# Patient Record
Sex: Female | Born: 2000 | Race: Black or African American | Hispanic: No | Marital: Single | Smoking: Current every day smoker
Health system: Southern US, Community
[De-identification: ages and names within clinical notes are randomized; demographics above are authoritative.]

---
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
## 2020-04-13 ENCOUNTER — Emergency Department (HOSPITAL_COMMUNITY)
Admission: EM | Admit: 2020-04-13 | Discharge: 2020-04-13 | Disposition: A | Discharge status: Home / Self Care | Payer: Medicaid Other | Attending: Emergency Medicine | Admitting: Emergency Medicine

## 2020-04-13 ENCOUNTER — Other Ambulatory Visit: Payer: Self-pay

## 2020-04-13 ENCOUNTER — Encounter (HOSPITAL_COMMUNITY): Payer: Self-pay | Admitting: Pediatrics

## 2020-04-13 DIAGNOSIS — R0981 Nasal congestion: Secondary | ICD-10-CM | POA: Insufficient documentation

## 2020-04-13 DIAGNOSIS — Z79899 Other long term (current) drug therapy: Secondary | ICD-10-CM | POA: Diagnosis not present

## 2020-04-13 MED ORDER — CETIRIZINE HCL 10 MG PO TABS: 10.0000 mg | ORAL_TABLET | Freq: Every day | ORAL | 0 refills | Status: DC | PRN

## 2020-04-13 MED ORDER — BENZONATATE 100 MG PO CAPS: 100.0000 mg | ORAL_CAPSULE | Freq: Three times a day (TID) | ORAL | 0 refills | Status: DC

## 2020-04-13 MED ORDER — FLUTICASONE PROPIONATE 50 MCG/ACT NA SUSP: 1.0000 | Freq: Every day | NASAL | 0 refills | Status: DC | PRN

## 2020-04-13 NOTE — Discharge Instructions (Addendum)
You were seen in the emergency today for upper respiratory symptoms, we suspect your symptoms are related to allergies or a virus at this time.  I have prescribed you multiple medications to treat your symptoms.   -Flonase to be used 1 spray in each nostril daily.  This medication is used to treat your congestion.  -Tessalon can be taken once every 8 hours as needed.  This medication is used to treat your cough.  -Zyrtec- take once daily as needed for allergies.  We have prescribed you new medication(s) today. Discuss the medications prescribed today with your pharmacist as they can have adverse effects and interactions with your other medicines including over the counter and prescribed medications. Seek medical evaluation if you start to experience new or abnormal symptoms after taking one of these medicines, seek care immediately if you start to experience difficulty breathing, feeling of your throat closing, facial swelling, or rash as these could be indications of a more serious allergic reaction  You will need to follow-up with your primary care provider in 1 week if your symptoms have not improved.  If you do not have a primary care provider one is provided in your discharge instructions.  Return to the emergency department for any new or worsening symptoms including but not limited to persistent fever for 5 days, difficulty breathing, chest pain, rashes, passing out, or any other concerns.

## 2020-04-13 NOTE — ED Triage Notes (Signed)
URI symptoms x 3 days; denies past medical history

## 2020-04-13 NOTE — ED Provider Notes (Signed)
[facility] EMERGENCY DEPARTMENT Provider Note   CSN: [id number] Arrival date & time: 04/13/20  1221     History Chief Complaint  Patient presents with  . Nasal Congestion    Sandra Moreno is a 19 y.o. female without significant past medical hx who presents to the ED with complaints of nasal congestion x 5 days. Patient reports nasal congestion, ear pressure, sinus pressure, and cough productive of sputum. No alleviating/aggravating factors. Denies fever, chills, dyspnea, chest pain, or vomiting. Was having sore throat but this resolved. Feels like prior allergies.   HPI     History reviewed. No pertinent past medical history.  There are no problems to display for this patient.   History reviewed. No pertinent surgical history.   OB History   No obstetric history on file.     No family history on file.  Social History   Tobacco Use  . Smoking status: Never Smoker  . Smokeless tobacco: Never Used  Substance Use Topics  . Alcohol use: No  . Drug use: No    Home Medications Prior to Admission medications   Medication Sig Start Date End Date Taking? Authorizing Provider  ferrous sulfate (EQL SLOW RELEASE IRON) 160 (50 Fe) MG TBCR SR tablet Take 1 tablet (160 mg total) by mouth daily. 08/30/16   Emily Filbert, MD  ferrous sulfate (EQL SLOW RELEASE IRON) 160 (50 Fe) MG TBCR SR tablet Take 1 tablet (160 mg total) by mouth daily. 08/30/16   Emily Filbert, MD  fluticasone (FLONASE) 50 MCG/ACT nasal spray Place 1 spray into both nostrils daily. 08/28/15   Menshew, Charlesetta Ivory, PA-C  hydrOXYzine (ATARAX/VISTARIL) 50 MG tablet Take 1 tablet (50 mg total) by mouth 3 (three) times daily as needed. 05/27/15   Joni Reining, PA-C  ibuprofen (ADVIL,MOTRIN) 600 MG tablet Take 1 tablet (600 mg total) by mouth every 8 (eight) hours as needed. 08/30/16   Emily Filbert, MD  methylPREDNISolone (MEDROL DOSEPAK) 4 MG TBPK tablet Take Tapered  dose as directed 05/27/15   Joni Reining, PA-C    Allergies    Augmentin [amoxicillin-pot clavulanate]  Review of Systems   Review of Systems  Constitutional: Negative for chills and fever.  HENT: Positive for congestion, ear pain, sinus pressure and sore throat (resolved). Negative for dental problem, trouble swallowing and voice change.   Respiratory: Positive for cough. Negative for shortness of breath.   Cardiovascular: Negative for chest pain.  Gastrointestinal: Negative for abdominal pain and vomiting.    Physical Exam Updated Vital Signs BP 125/75 (BP Location: Left Arm)   Pulse 99   Temp 98.7 F (37.1 C) (Oral)   Resp 18   Ht 5\' 5"  (1.651 m)   Wt 122.5 kg   SpO2 98%   BMI 44.93 kg/m   Physical Exam Vitals and nursing note reviewed.  Constitutional:      General: She is not in acute distress.    Appearance: She is well-developed.  HENT:     Head: Normocephalic and atraumatic.     Right Ear: Ear canal normal. Tympanic membrane is not perforated, erythematous, retracted or bulging.     Left Ear: Ear canal normal. Tympanic membrane is not perforated, erythematous, retracted or bulging.     Ears:     Comments: No mastoid erythema/swelling/tenderness.     Nose: Congestion present.     Right Sinus: No maxillary sinus tenderness or frontal sinus tenderness.     Left  Sinus: No maxillary sinus tenderness or frontal sinus tenderness.     Comments: Boggy turbinates present.     Mouth/Throat:     Pharynx: Uvula midline. No oropharyngeal exudate or posterior oropharyngeal erythema.     Comments: Posterior oropharynx is symmetric appearing. Patient tolerating own secretions without difficulty. No trismus. No drooling. No hot potato voice. No swelling beneath the tongue, submandibular compartment is soft.  Eyes:     General:        Right eye: No discharge.        Left eye: No discharge.     Conjunctiva/sclera: Conjunctivae normal.     Pupils: Pupils are equal, round, and  reactive to light.  Cardiovascular:     Rate and Rhythm: Normal rate and regular rhythm.     Heart sounds: No murmur heard.   Pulmonary:     Effort: Pulmonary effort is normal. No respiratory distress.     Breath sounds: Normal breath sounds. No wheezing, rhonchi or rales.  Abdominal:     General: There is no distension.     Palpations: Abdomen is soft.     Tenderness: There is no abdominal tenderness.  Musculoskeletal:     Cervical back: Normal range of motion and neck supple. No edema or rigidity.  Lymphadenopathy:     Cervical: No cervical adenopathy.  Skin:    General: Skin is warm and dry.     Findings: No rash.  Neurological:     Mental Status: She is alert.  Psychiatric:        Behavior: Behavior normal.     ED Results / Procedures / Treatments   Labs (all labs ordered are listed, but only abnormal results are displayed) Labs Reviewed - No data to display  EKG None  Radiology No results found.  Procedures Procedures (including critical care time)  Medications Ordered in ED Medications - No data to display  ED Course  I have reviewed the triage vital signs and the nursing notes.  Pertinent labs & imaging results that were available during my care of the patient were reviewed by me and considered in my medical decision making (see chart for details).  Sandra Moreno was evaluated in Emergency Department on 04/13/2020 for the symptoms described in the history of present illness. He/she was evaluated in the context of the global COVID-19 pandemic, which necessitated consideration that the patient might be at risk for infection with the SARS-CoV-2 virus that causes COVID-19. Institutional protocols and algorithms that pertain to the evaluation of patients at risk for COVID-19 are in a state of rapid change based on information released by regulatory bodies including the CDC and federal and state organizations. These policies and algorithms were followed during the  patient's care in the ED.    MDM Rules/Calculators/A&P                         Patient presents with upper respiratory sxs. Patient is nontoxic appearing, in no apparent distress, vitals are WNL. Patient is afebrile in the ED, lungs are CTA, low suspicion for pneumonia- discussed option of CXR, patient in agreement with forgoing this. There is no wheezing or signs of respiratory distress. Sxs onset < 7 days, afebrile, no sinus tenderness, doubt acute bacterial sinusitis. Centor score 0, doubt strep pharyngitis. No evidence of AOM on exam. No meningeal signs. No history components or rashes to raise concern for tic borne illness. Suspect viral vs allergic etiology at this time,  favor allergic, and will treat supportively with Flonase, tessalon, and zyrtec. I discussed treatment plan, need for PCP follow-up, and return precautions with the patient. Provided opportunity for questions, patient confirmed understanding and is in agreement with plan.    Final Clinical Impression(s) / ED Diagnoses Final diagnoses:  Nasal congestion    Rx / DC Orders ED Discharge Orders         Ordered    fluticasone (FLONASE) 50 MCG/ACT nasal spray  Daily PRN     Discontinue  Reprint     04/13/20 1403    benzonatate (TESSALON) 100 MG capsule  Every 8 hours     Discontinue  Reprint     04/13/20 1403    cetirizine (ZYRTEC ALLERGY) 10 MG tablet  Daily PRN     Discontinue  Reprint     04/13/20 1403           Jahmani Staup, Pleas Koch, PA-C 04/13/20 1404    Terrilee Files, MD 04/13/20 Ernestina Columbia

## 2020-05-10 DIAGNOSIS — Z20822 Contact with and (suspected) exposure to covid-19: Secondary | ICD-10-CM | POA: Diagnosis not present

## 2020-05-10 DIAGNOSIS — Z03818 Encounter for observation for suspected exposure to other biological agents ruled out: Secondary | ICD-10-CM | POA: Diagnosis not present

## 2022-11-21 DIAGNOSIS — Z114 Encounter for screening for human immunodeficiency virus [HIV]: Secondary | ICD-10-CM | POA: Diagnosis not present

## 2022-11-21 DIAGNOSIS — Z113 Encounter for screening for infections with a predominantly sexual mode of transmission: Secondary | ICD-10-CM | POA: Diagnosis not present

## 2023-01-09 DIAGNOSIS — Z113 Encounter for screening for infections with a predominantly sexual mode of transmission: Secondary | ICD-10-CM | POA: Diagnosis not present

## 2023-01-10 ENCOUNTER — Ambulatory Visit (INDEPENDENT_AMBULATORY_CARE_PROVIDER_SITE_OTHER): Payer: Medicaid Other | Admitting: Primary Care

## 2023-01-10 ENCOUNTER — Encounter (INDEPENDENT_AMBULATORY_CARE_PROVIDER_SITE_OTHER): Payer: Self-pay

## 2023-02-12 ENCOUNTER — Telehealth (INDEPENDENT_AMBULATORY_CARE_PROVIDER_SITE_OTHER): Payer: Self-pay | Admitting: Primary Care

## 2023-02-12 NOTE — Telephone Encounter (Signed)
Called pt and left a VM. 02/12/23

## 2023-02-13 ENCOUNTER — Ambulatory Visit (INDEPENDENT_AMBULATORY_CARE_PROVIDER_SITE_OTHER): Payer: Medicaid Other | Admitting: Primary Care

## 2023-02-13 ENCOUNTER — Encounter (INDEPENDENT_AMBULATORY_CARE_PROVIDER_SITE_OTHER): Payer: Self-pay | Admitting: Primary Care

## 2023-02-13 VITALS — BP 124/83 | HR 81 | Resp 16 | Ht 65.0 in | Wt 255.0 lb

## 2023-02-13 DIAGNOSIS — Z7689 Persons encountering health services in other specified circumstances: Secondary | ICD-10-CM

## 2023-02-13 DIAGNOSIS — Z1159 Encounter for screening for other viral diseases: Secondary | ICD-10-CM

## 2023-02-13 DIAGNOSIS — G43109 Migraine with aura, not intractable, without status migrainosus: Secondary | ICD-10-CM | POA: Diagnosis not present

## 2023-02-13 DIAGNOSIS — N921 Excessive and frequent menstruation with irregular cycle: Secondary | ICD-10-CM

## 2023-02-13 DIAGNOSIS — Z833 Family history of diabetes mellitus: Secondary | ICD-10-CM | POA: Diagnosis not present

## 2023-02-13 DIAGNOSIS — Z23 Encounter for immunization: Secondary | ICD-10-CM

## 2023-02-13 DIAGNOSIS — Z6841 Body Mass Index (BMI) 40.0 and over, adult: Secondary | ICD-10-CM | POA: Diagnosis not present

## 2023-02-13 DIAGNOSIS — G43909 Migraine, unspecified, not intractable, without status migrainosus: Secondary | ICD-10-CM | POA: Insufficient documentation

## 2023-02-13 DIAGNOSIS — Z114 Encounter for screening for human immunodeficiency virus [HIV]: Secondary | ICD-10-CM | POA: Diagnosis not present

## 2023-02-13 NOTE — Patient Instructions (Addendum)
Migraine Headache A migraine headache is an intense pulsing or throbbing pain on one or both sides of the head. Migraine headaches may also cause other symptoms, such as nausea, vomiting, and sensitivity to light and noise. A migraine headache can last from 4 hours to 3 days. Talk with your health care provider about what things may bring on (trigger) your migraine headaches. What are the causes? The exact cause is not known. However, a migraine may be caused when nerves in the brain get irritated and release chemicals that cause blood vessels to become inflamed. This inflammation causes pain. Migraines may be triggered or caused by: Smoking. Medicines, such as: Nitroglycerin, which is used to treat chest pain. Birth control pills. Estrogen. Certain blood pressure medicines. Foods or drinks that contain nitrates, glutamate, aspartame, MSG, or tyramine. Certain foods or drinks, such as aged cheeses, chocolate, alcohol, or caffeine. Doing physical activity that is very hard. Other triggers may include: Menstruation. Pregnancy. Hunger. Stress. Getting too much or too little sleep. Weather changes. Tiredness (fatigue). What increases the risk? The following factors may make you more likely to have migraine headaches: Being between the ages of 25-55 years old. Being female. Having a family history of migraine headaches. Being Caucasian. Having a mental health condition, such as depression or anxiety. Being obese. What are the signs or symptoms? The main symptom of this condition is pulsing or throbbing pain. This pain may: Happen in any area of the head, such as on one or both sides. Make it hard to do daily activities. Get worse with physical activity. Get worse around bright lights, loud noises, or smells. Other symptoms may include: Nausea. Vomiting. Dizziness. Before a migraine headache starts, you may get warning signs (an aura). An aura may include: Seeing flashing lights or  having blind spots. Seeing bright spots, halos, or zigzag lines. Having tunnel vision or blurred vision. Having numbness or a tingling feeling. Having trouble talking. Having muscle weakness. After a migraine ends, you may have symptoms. These may include: Feeling tired. Trouble concentrating. How is this diagnosed? A migraine headache can be diagnosed based on: Your symptoms. A physical exam. Tests, such as: A CT scan or an MRI of the head. These tests can help rule out other causes of headaches. Taking fluid from the spine (lumbar puncture) to examine it (cerebrospinal fluid analysis, or CSF analysis). How is this treated? This condition may be treated with medicines that: Relieve pain and nausea. Prevent migraines. Treatment may also include: Acupuncture. Lifestyle changes like avoiding foods that trigger migraine headaches. Learning ways to control your body (biofeedback). Talk therapy to help you know and deal with negative thoughts (cognitive behavioral therapy). Follow these instructions at home: Medicines Take over-the-counter and prescription medicines only as told by your provider. Ask your provider if the medicine prescribed to you: Requires you to avoid driving or using machinery. Can cause constipation. You may need to take these actions to prevent or treat constipation: Drink enough fluid to keep your pee (urine) pale yellow. Take over-the-counter or prescription medicines. Eat foods that are high in fiber, such as beans, whole grains, and fresh fruits and vegetables. Limit foods that are high in fat and processed sugars, such as fried or sweet foods. Lifestyle  Do not drink alcohol. Do not use any products that contain nicotine or tobacco. These products include cigarettes, chewing tobacco, and vaping devices, such as e-cigarettes. If you need help quitting, ask your provider. Get 7-9 hours of sleep each night, or the   amount recommended by your provider. Find  ways to manage stress, such as meditation, deep breathing, or yoga. Try to exercise regularly. This can help lessen how bad and how often your migraines occur. General instructions Keep a journal to find out what triggers your migraines, so you can avoid those things. For example, write down: What you eat and drink. How much sleep you get. Any change to your diet or medicines. If you have a migraine headache: Avoid things that make your symptoms worse, such as bright lights. Lie down in a dark, quiet room. Do not drive or use machinery. Ask your provider what activities are safe for you while you have symptoms. Keep all follow-up visits. Your provider will monitor your symptoms and recommend any further treatment. Where to find more information Coalition for Headache and Migraine Patients (CHAMP): headachemigraine.org American Migraine Foundation: americanmigrainefoundation.org National Headache Foundation: headaches.org Contact a health care provider if: You have symptoms that are different or worse than your usual migraine headache symptoms. You have more than 15 days of headaches in one month. Get help right away if: Your migraine headache becomes severe or lasts more than 72 hours. You have a fever or stiff neck. You have vision loss. Your muscles feel weak or like you cannot control them. You lose your balance often or have trouble walking. You faint. You have a seizure. This information is not intended to replace advice given to you by your health care provider. Make sure you discuss any questions you have with your health care provider. Document Revised: 05/14/2022 Document Reviewed: 05/14/2022 Elsevier Patient Education  2023 [employer] Calorie Counting for Sandra Moreno Loss Calories are units of energy. Your body needs a certain number of calories from food to keep going throughout the day. When you eat or drink more calories than your body needs, your body stores the extra  calories mostly as fat. When you eat or drink fewer calories than your body needs, your body burns fat to get the energy it needs. Calorie counting means keeping track of how many calories you eat and drink each day. Calorie counting can be helpful if you need to lose weight. If you eat fewer calories than your body needs, you should lose weight. Ask your health care provider what a healthy weight is for you. For calorie counting to work, you will need to eat the right number of calories each day to lose a healthy amount of weight per week. A dietitian can help you figure out how many calories you need in a day and will suggest ways to reach your calorie goal. A healthy amount of weight to lose each week is usually 1-2 lb (0.5-0.9 kg). This usually means that your daily calorie intake should be reduced by 500-750 calories. Eating 1,200-1,500 calories a day can help most women lose weight. Eating 1,500-1,800 calories a day can help most men lose weight. What do I need to know about calorie counting? Work with your health care provider or dietitian to determine how many calories you should get each day. To meet your daily calorie goal, you will need to: Find out how many calories are in each food that you would like to eat. Try to do this before you eat. Decide how much of the food you plan to eat. Keep a food log. Do this by writing down what you ate and how many calories it had. To successfully lose weight, it is important to balance calorie counting with a healthy lifestyle that  includes regular activity. Where do I find calorie information?  The number of calories in a food can be found on a Nutrition Facts label. If a food does not have a Nutrition Facts label, try to look up the calories online or ask your dietitian for help. Remember that calories are listed per serving. If you choose to have more than one serving of a food, you will have to multiply the calories per serving by the number of  servings you plan to eat. For example, the label on a package of bread might say that a serving size is 1 slice and that there are 90 calories in a serving. If you eat 1 slice, you will have eaten 90 calories. If you eat 2 slices, you will have eaten 180 calories. How do I keep a food log? After each time that you eat, record the following in your food log as soon as possible: What you ate. Be sure to include toppings, sauces, and other extras on the food. How much you ate. This can be measured in cups, ounces, or number of items. How many calories were in each food and drink. The total number of calories in the food you ate. Keep your food log near you, such as in a pocket-sized notebook or on an app or website on your mobile phone. Some programs will calculate calories for you and show you how many calories you have left to meet your daily goal. What are some portion-control tips? Know how many calories are in a serving. This will help you know how many servings you can have of a certain food. Use a measuring cup to measure serving sizes. You could also try weighing out portions on a kitchen scale. With time, you will be able to estimate serving sizes for some foods. Take time to put servings of different foods on your favorite plates or in your favorite bowls and cups so you know what a serving looks like. Try not to eat straight from a food's packaging, such as from a bag or box. Eating straight from the package makes it hard to see how much you are eating and can lead to overeating. Put the amount you would like to eat in a cup or on a plate to make sure you are eating the right portion. Use smaller plates, glasses, and bowls for smaller portions and to prevent overeating. Try not to multitask. For example, avoid watching TV or using your computer while eating. If it is time to eat, sit down at a table and enjoy your food. This will help you recognize when you are full. It will also help you be  more mindful of what and how much you are eating. What are tips for following this plan? Reading food labels Check the calorie count compared with the serving size. The serving size may be smaller than what you are used to eating. Check the source of the calories. Try to choose foods that are high in protein, fiber, and vitamins, and low in saturated fat, trans fat, and sodium. Shopping Read nutrition labels while you shop. This will help you make healthy decisions about which foods to buy. Pay attention to nutrition labels for low-fat or fat-free foods. These foods sometimes have the same number of calories or more calories than the full-fat versions. They also often have added sugar, starch, or salt to make up for flavor that was removed with the fat. Make a grocery list of lower-calorie foods and stick  to it. Cooking Try to cook your favorite foods in a healthier way. For example, try baking instead of frying. Use low-fat dairy products. Meal planning Use more fruits and vegetables. One-half of your plate should be fruits and vegetables. Include lean proteins, such as chicken, [place], and fish. Lifestyle Each week, aim to do one of the following: 150 minutes of moderate exercise, such as walking. 75 minutes of vigorous exercise, such as running. General information Know how many calories are in the foods you eat most often. This will help you calculate calorie counts faster. Find a way of tracking calories that works for you. Get creative. Try different apps or programs if writing down calories does not work for you. What foods should I eat?  Eat nutritious foods. It is better to have a nutritious, high-calorie food, such as an avocado, than a food with few nutrients, such as a bag of potato chips. Use your calories on foods and drinks that will fill you up and will not leave you hungry soon after eating. Examples of foods that fill you up are nuts and nut butters, vegetables, lean  proteins, and high-fiber foods such as whole grains. High-fiber foods are foods with more than 5 g of fiber per serving. Pay attention to calories in drinks. Low-calorie drinks include water and unsweetened drinks. The items listed above may not be a complete list of foods and beverages you can eat. Contact a dietitian for more information. What foods should I limit? Limit foods or drinks that are not good sources of vitamins, minerals, or protein or that are high in unhealthy fats. These include: Candy. Other sweets. Sodas, specialty coffee drinks, alcohol, and juice. The items listed above may not be a complete list of foods and beverages you should avoid. Contact a dietitian for more information. How do I count calories when eating out? Pay attention to portions. Often, portions are much larger when eating out. Try these tips to keep portions smaller: Consider sharing a meal instead of getting your own. If you get your own meal, eat only half of it. Before you start eating, ask for a container and put half of your meal into it. When available, consider ordering smaller portions from the menu instead of full portions. Pay attention to your food and drink choices. Knowing the way food is cooked and what is included with the meal can help you eat fewer calories. If calories are listed on the menu, choose the lower-calorie options. Choose dishes that include vegetables, fruits, whole grains, low-fat dairy products, and lean proteins. Choose items that are boiled, broiled, grilled, or steamed. Avoid items that are buttered, battered, fried, or served with cream sauce. Items labeled as crispy are usually fried, unless stated otherwise. Choose water, low-fat milk, unsweetened iced tea, or other drinks without added sugar. If you want an alcoholic beverage, choose a lower-calorie option, such as a glass of wine or light beer. Ask for dressings, sauces, and syrups on the side. These are usually high in  calories, so you should limit the amount you eat. If you want a salad, choose a garden salad and ask for grilled meats. Avoid extra toppings such as bacon, cheese, or fried items. Ask for the dressing on the side, or ask for olive oil and vinegar or lemon to use as dressing. Estimate how many servings of a food you are given. Knowing serving sizes will help you be aware of how much food you are eating at restaurants. Where to  find more information Centers for Disease Control and Prevention: FootballExhibition.com.br U.S. Department of Agriculture: WrestlingReporter.dk Summary Calorie counting means keeping track of how many calories you eat and drink each day. If you eat fewer calories than your body needs, you should lose weight. A healthy amount of weight to lose per week is usually 1-2 lb (0.5-0.9 kg). This usually means reducing your daily calorie intake by 500-750 calories. The number of calories in a food can be found on a Nutrition Facts label. If a food does not have a Nutrition Facts label, try to look up the calories online or ask your dietitian for help. Use smaller plates, glasses, and bowls for smaller portions and to prevent overeating. Use your calories on foods and drinks that will fill you up and not leave you hungry shortly after a meal. This information is not intended to replace advice given to you by your health care provider. Make sure you discuss any questions you have with your health care provider. Document Revised: 10/29/2019 Document Reviewed: 10/29/2019 Elsevier Patient Education  2023 [employer].

## 2023-02-13 NOTE — Progress Notes (Unsigned)
New Patient Office Visit  Subjective    Patient ID: Sandra Moreno, female    DOB: [birth year]  Age: 22 y.o. MRN: [id number]  CC:  Chief Complaint  Patient presents with   New Patient (Initial Visit)   Migraine    HPI Sandra Moreno  is a 22 year old morbid obese female who is sexually active with a female partner - uses no birth control stated God is on her side presents to establish care. She has migraines  throbbing pain left side frontal area of the head.  They may come without aura-dizziness, nausea, sensitive to light sound or smell.  After migraine she becomes nausea . She endorses  Caffeine, aged cheese, and chocolate drinking alcohol, and  smoking. Decrease Pepsi to once a week.    Patient has No headache, No chest pain, No abdominal pain - No Nausea, No new weakness tingling or numbness, No Cough - shortness of breath   Outpatient Encounter Medications as of 02/13/2023  Medication Sig   [DISCONTINUED] benzonatate (TESSALON) 100 MG capsule Take 1 capsule (100 mg total) by mouth every 8 (eight) hours.   [DISCONTINUED] cetirizine (ZYRTEC ALLERGY) 10 MG tablet Take 1 tablet (10 mg total) by mouth daily as needed for allergies or rhinitis.   [DISCONTINUED] ferrous sulfate (EQL SLOW RELEASE IRON) 160 (50 Fe) MG TBCR SR tablet Take 1 tablet (160 mg total) by mouth daily.   [DISCONTINUED] ferrous sulfate (EQL SLOW RELEASE IRON) 160 (50 Fe) MG TBCR SR tablet Take 1 tablet (160 mg total) by mouth daily.   [DISCONTINUED] fluticasone (FLONASE) 50 MCG/ACT nasal spray Place 1 spray into both nostrils daily as needed for allergies or rhinitis.   [DISCONTINUED] hydrOXYzine (ATARAX/VISTARIL) 50 MG tablet Take 1 tablet (50 mg total) by mouth 3 (three) times daily as needed.   [DISCONTINUED] ibuprofen (ADVIL,MOTRIN) 600 MG tablet Take 1 tablet (600 mg total) by mouth every 8 (eight) hours as needed.   [DISCONTINUED] methylPREDNISolone (MEDROL DOSEPAK) 4 MG TBPK tablet Take Tapered dose as  directed   No facility-administered encounter medications on file as of 02/13/2023.    No past medical history on file.  No past surgical history on file.  No family history on file.  Social History   Socioeconomic History   Marital status: Single    Spouse name: Not on file   Number of children: Not on file   Years of education: Not on file   Highest education level: Not on file  Occupational History   Not on file  Tobacco Use   Smoking status: Every Day    Types: Cigarettes   Smokeless tobacco: Never  Vaping Use   Vaping Use: Every day  Substance and Sexual Activity   Alcohol use: No   Drug use: Not on file   Sexual activity: Yes  Other Topics Concern   Not on file  Social History Narrative   Not on file   Social Determinants of Health   Financial Resource Strain: Not on file  Food Insecurity: Not on file  Transportation Needs: Not on file  Physical Activity: Not on file  Stress: Not on file  Social Connections: Not on file  Intimate Partner Violence: Not on file    ROS Comprehensive ROS Pertinent positive and negative noted in HPI     Objective    Blood Pressure 124/83   Pulse 81   Respiration 16   Height 5\' 5"  (1.651 m)   Weight 255 lb (115.7 kg)  Last Menstrual Period 01/19/2023   Oxygen Saturation 100%   Body Mass Index 42.43 kg/m  Physical exam: General: Vital signs reviewed.  Patient is well-developed and well-nourished, morbid obese female  in no acute distress and cooperative with exam. Head: Normocephalic and atraumatic. Eyes: EOMI, conjunctivae normal, no scleral icterus. Neck: Supple, trachea midline, normal ROM, no JVD, masses, thyromegaly, or carotid bruit present. Cardiovascular: RRR, S1 normal, S2 normal, no murmurs, gallops, or rubs. Pulmonary/Chest: Clear to auscultation bilaterally, no wheezes, rales, or rhonchi. Abdominal: Soft, non-tender, non-distended, BS +, no masses, organomegaly, or guarding present. Musculoskeletal: No  joint deformities, erythema, or stiffness, ROM full and nontender. Extremities: No lower extremity edema bilaterally,  pulses symmetric and intact bilaterally. No cyanosis or clubbing. Neurological: A&O x3, Strength is normal Skin: Warm, dry and intact. No rashes or erythema. Psychiatric: Normal mood and affect. speech and behavior is normal. Cognition and memory are normal.    Assessment & Plan:  Sandra Moreno was seen today for new patient (initial visit) and migraine.  Diagnoses and all orders for this visit:  Encounter for HCV screening test for low risk patient -     HCV Ab w Reflex to Quant PCR  Encounter for screening for HIV -     HIV Antibody (routine testing w rflx)  Need for diphtheria-tetanus-pertussis (Tdap) vaccine -     Tdap vaccine greater than or equal to 7yo IM  Encounter to establish care  Morbid obesity (HCC) Discussed diet and exercise for person with BMI >25. Instructed: You must burn more calories than you eat. Losing 5 percent of your body weight should be considered a success. In the longer term, losing more than 15 percent of your body weight and staying at this weight is an extremely good result. However, keep in mind that even losing 5 percent of your body weight leads to important health benefits, so try not to get discouraged if you're not able to lose more than this. Will recheck weight in 3-6 months.  -     TSH + free T4 -     CMP14+EGFR  Migraine with aura and without status migrainosus, not intractable Provided information on AVS until labs reviewed   Family history of diabetes mellitus -     Hemoglobin A1c  Menorrhagia with irregular cycle -     CBC with Differential    Return in about 6 weeks (around 03/27/2023) for pap.   Grayce Sessions, NP

## 2023-02-14 LAB — CMP14+EGFR
ALT: 13 IU/L (ref 0–32)
AST: 15 IU/L (ref 0–40)
Albumin/Globulin Ratio: 2.1 (ref 1.2–2.2)
Albumin: 4.6 g/dL (ref 4.0–5.0)
Alkaline Phosphatase: 62 IU/L (ref 44–121)
BUN/Creatinine Ratio: 14 (ref 9–23)
BUN: 12 mg/dL (ref 6–20)
Bilirubin Total: 0.3 mg/dL (ref 0.0–1.2)
CO2: 22 mmol/L (ref 20–29)
Calcium: 9.5 mg/dL (ref 8.7–10.2)
Chloride: 104 mmol/L (ref 96–106)
Creatinine, Ser: 0.85 mg/dL (ref 0.57–1.00)
Globulin, Total: 2.2 g/dL (ref 1.5–4.5)
Glucose: 81 mg/dL (ref 70–99)
Potassium: 4.4 mmol/L (ref 3.5–5.2)
Sodium: 141 mmol/L (ref 134–144)
Total Protein: 6.8 g/dL (ref 6.0–8.5)
eGFR: 99 mL/min/{1.73_m2} (ref >59)

## 2023-02-14 LAB — HEMOGLOBIN A1C
Est. average glucose Bld gHb Est-mCnc: 111 mg/dL
Hgb A1c MFr Bld: 5.5 % (ref 4.8–5.6)

## 2023-02-14 LAB — CBC WITH DIFFERENTIAL/PLATELET
Basophils Absolute: 0.1 10*3/uL (ref 0.0–0.2)
Basos: 1 %
EOS (ABSOLUTE): 0.1 10*3/uL (ref 0.0–0.4)
Eos: 1 %
Hematocrit: 38.3 % (ref 34.0–46.6)
Hemoglobin: 11.4 g/dL (ref 11.1–15.9)
Immature Grans (Abs): 0 10*3/uL (ref 0.0–0.1)
Immature Granulocytes: 0 %
Lymphocytes Absolute: 2.1 10*3/uL (ref 0.7–3.1)
Lymphs: 44 %
MCH: 21 pg — ABNORMAL LOW (ref 26.6–33.0)
MCHC: 29.8 g/dL — ABNORMAL LOW (ref 31.5–35.7)
MCV: 71 fL — ABNORMAL LOW (ref 79–97)
Monocytes Absolute: 0.3 10*3/uL (ref 0.1–0.9)
Monocytes: 6 %
Neutrophils Absolute: 2.3 10*3/uL (ref 1.4–7.0)
Neutrophils: 48 %
Platelets: 291 10*3/uL (ref 150–450)
RBC: 5.43 x10E6/uL — ABNORMAL HIGH (ref 3.77–5.28)
RDW: 16.8 % — ABNORMAL HIGH (ref 11.7–15.4)
WBC: 4.8 10*3/uL (ref 3.4–10.8)

## 2023-02-14 LAB — TSH+FREE T4
Free T4: 1.18 ng/dL (ref 0.82–1.77)
TSH: 2.66 u[IU]/mL (ref 0.450–4.500)

## 2023-02-14 LAB — HIV ANTIBODY (ROUTINE TESTING W REFLEX): HIV Screen 4th Generation wRfx: NONREACTIVE

## 2023-02-14 LAB — HCV INTERPRETATION

## 2023-02-14 LAB — HCV AB W REFLEX TO QUANT PCR: HCV Ab: NONREACTIVE

## 2023-02-17 ENCOUNTER — Encounter (INDEPENDENT_AMBULATORY_CARE_PROVIDER_SITE_OTHER): Payer: Self-pay | Admitting: Primary Care

## 2023-03-05 ENCOUNTER — Ambulatory Visit (INDEPENDENT_AMBULATORY_CARE_PROVIDER_SITE_OTHER): Payer: Medicaid Other

## 2023-03-05 ENCOUNTER — Other Ambulatory Visit (HOSPITAL_COMMUNITY)
Admission: RE | Admit: 2023-03-05 | Discharge: 2023-03-05 | Disposition: A | Discharge status: Home / Self Care | Payer: Medicaid Other | Source: Ambulatory Visit | Attending: Primary Care | Admitting: Primary Care

## 2023-03-05 DIAGNOSIS — N898 Other specified noninflammatory disorders of vagina: Secondary | ICD-10-CM | POA: Diagnosis not present

## 2023-03-05 DIAGNOSIS — R300 Dysuria: Secondary | ICD-10-CM

## 2023-03-05 LAB — POCT URINALYSIS DIP (CLINITEK)
Bilirubin, UA: NEGATIVE
Glucose, UA: NEGATIVE mg/dL
Ketones, POC UA: NEGATIVE mg/dL
Leukocytes, UA: NEGATIVE
Nitrite, UA: NEGATIVE
POC PROTEIN,UA: NEGATIVE
Spec Grav, UA: 1.025 (ref 1.010–1.025)
Urobilinogen, UA: 1 E.U./dL
pH, UA: 6.5 (ref 5.0–8.0)

## 2023-03-05 NOTE — Progress Notes (Signed)
Pt came into the office for a nurse visit

## 2023-03-07 ENCOUNTER — Telehealth: Payer: Medicaid Other | Admitting: Emergency Medicine

## 2023-03-07 DIAGNOSIS — R300 Dysuria: Secondary | ICD-10-CM

## 2023-03-07 LAB — CERVICOVAGINAL ANCILLARY ONLY
Bacterial Vaginitis (gardnerella): NEGATIVE
Candida Glabrata: NEGATIVE
Candida Vaginitis: NEGATIVE
Chlamydia: NEGATIVE
Comment: NEGATIVE
Comment: NEGATIVE
Comment: NEGATIVE
Comment: NEGATIVE
Comment: NEGATIVE
Comment: NORMAL
Neisseria Gonorrhea: NEGATIVE
Trichomonas: NEGATIVE

## 2023-03-07 MED ORDER — LIDOCAINE VISCOUS HCL 2 % MT SOLN: 15.0000 mL | OROMUCOSAL | 0 refills | Status: DC | PRN

## 2023-03-07 NOTE — Patient Instructions (Signed)
  Thad Ranger, thank you for joining Roxy Horseman, PA-C for today's virtual visit.  While this provider is not your primary care provider (PCP), if your PCP is located in our provider database this encounter information will be shared with them immediately following your visit.   A [facility] MyChart account gives you access to today's visit and all your visits, tests, and labs performed at [facility] " click here if you don't have a [facility] MyChart account or go to [email or web address]  Consent: (Patient) Sandra Moreno provided verbal consent for this virtual visit at the beginning of the encounter.  Current Medications:  Current Outpatient Medications:    lidocaine (XYLOCAINE) 2 % solution, Use as directed 15 mLs in the mouth or throat as needed for mouth pain., Disp: 100 mL, Rfl: 0   Medications ordered in this encounter:  Meds ordered this encounter  Medications   lidocaine (XYLOCAINE) 2 % solution    Sig: Use as directed 15 mLs in the mouth or throat as needed for mouth pain.    Dispense:  100 mL    Refill:  0    Order Specific Question:   Supervising Provider    Answer:   Merrilee Jansky [[id number]]     *If you need refills on other medications prior to your next appointment, please contact your pharmacy*  Follow-Up: Call back or seek an in-person evaluation if the symptoms worsen or if the condition fails to improve as anticipated.  [facility] [phone]  Other Instructions    If you have been instructed to have an in-person evaluation today at a local Urgent Care facility, please use the link below. It will take you to a list of all of our available [facility], including address, phone number and hours of operation. Please do not delay care.  [facility]  If you or a family member do not have a primary care provider, use the link below to schedule a visit and establish care. When you choose a  [facility] primary care physician or advanced practice provider, you gain a long-term partner in health. Find a Primary Care Provider  Learn more about [facility] in-office and virtual care options:  - Get Care Now

## 2023-03-07 NOTE — Progress Notes (Signed)
Virtual Visit Consent   Sandra Moreno, you are scheduled for a virtual visit with a [facility] provider today. Just as with appointments in the office, your consent must be obtained to participate. Your consent will be active for this visit and any virtual visit you may have with one of our providers in the next 365 days. If you have a MyChart account, a copy of this consent can be sent to you electronically.  As this is a virtual visit, video technology does not allow for your provider to perform a traditional examination. This may limit your provider's ability to fully assess your condition. If your provider identifies any concerns that need to be evaluated in person or the need to arrange testing (such as labs, EKG, etc.), we will make arrangements to do so. Although advances in technology are sophisticated, we cannot ensure that it will always work on either your end or our end. If the connection with a video visit is poor, the visit may have to be switched to a telephone visit. With either a video or telephone visit, we are not always able to ensure that we have a secure connection.  By engaging in this virtual visit, you consent to the provision of healthcare and authorize for your insurance to be billed (if applicable) for the services provided during this visit. Depending on your insurance coverage, you may receive a charge related to this service.  I need to obtain your verbal consent now. Are you willing to proceed with your visit today? Sandra Moreno has provided verbal consent on 03/07/2023 for a virtual visit (video or telephone). Roxy Horseman, PA-C  Date: 03/07/2023 10:10 AM  Virtual Visit via Video Note   I, Roxy Horseman, connected with  Sandra Moreno  ([id number], 2001-09-24) on 03/07/23 at 10:00 AM EDT by a video-enabled telemedicine application and verified that I am speaking with the correct person using two identifiers.  Location: Patient: Virtual Visit Location  Patient: Mobile Provider: Virtual Visit Location Provider: Home Office   I discussed the limitations of evaluation and management by telemedicine and the availability of in person appointments. The patient expressed understanding and agreed to proceed.    History of Present Illness: Sandra Moreno is a 22 y.o. who identifies as a female who was assigned female at birth, and is being seen today for dysuria.  States that she has had dysuria.  She states that she was seen by her doctor and had a negative UA.  I confirmed this in chart review.  She has a pending swab, but it was a self-swab, however, no discharge or bleeding.  She states that she was seen by a nurse and told she had some tears "down there."  HPI: HPI  Problems:  Patient Active Problem List   Diagnosis Date Noted   Migraine 02/13/2023   Morbid obesity (HCC) 02/13/2023    Allergies:  Allergies  Allergen Reactions   Augmentin [Amoxicillin-Pot Clavulanate]    Medications:  Current Outpatient Medications:    lidocaine (XYLOCAINE) 2 % solution, Use as directed 15 mLs in the mouth or throat as needed for mouth pain., Disp: 100 mL, Rfl: 0  Observations/Objective: Patient is well-developed, well-nourished in no acute distress.  Resting comfortably at home.  Head is normocephalic, atraumatic.  No labored breathing.  Speech is clear and coherent with logical content.  Patient is alert and oriented at baseline.    Assessment and Plan: 1. Dysuria  Meds ordered this encounter  Medications  lidocaine (XYLOCAINE) 2 % solution    Sig: Use as directed 15 mLs in the mouth or throat as needed for mouth pain.    Dispense:  100 mL    Refill:  0    Order Specific Question:   Supervising Provider    Answer:   Merrilee Jansky [id number]    Skin tears near genitals could cause these symptoms.  Will prescribe topical lidocaine for comfort.  Recommend in-person exam if not improving to rule out sores, lesions, herpes,  etc.  Follow Up Instructions: I discussed the assessment and treatment plan with the patient. The patient was provided an opportunity to ask questions and all were answered. The patient agreed with the plan and demonstrated an understanding of the instructions.  A copy of instructions were sent to the patient via MyChart unless otherwise noted below.     The patient was advised to call back or seek an in-person evaluation if the symptoms worsen or if the condition fails to improve as anticipated.  Time:  I spent 10 minutes with the patient via telehealth technology discussing the above problems/concerns.    Roxy Horseman, PA-C

## 2023-04-10 ENCOUNTER — Ambulatory Visit (INDEPENDENT_AMBULATORY_CARE_PROVIDER_SITE_OTHER): Payer: Medicaid Other | Admitting: Primary Care

## 2023-05-03 ENCOUNTER — Ambulatory Visit (INDEPENDENT_AMBULATORY_CARE_PROVIDER_SITE_OTHER): Payer: Medicaid Other | Admitting: Primary Care

## 2023-05-23 ENCOUNTER — Ambulatory Visit (INDEPENDENT_AMBULATORY_CARE_PROVIDER_SITE_OTHER): Payer: Medicaid Other | Admitting: Primary Care

## 2023-05-23 ENCOUNTER — Encounter (INDEPENDENT_AMBULATORY_CARE_PROVIDER_SITE_OTHER): Payer: Self-pay

## 2023-10-23 ENCOUNTER — Other Ambulatory Visit: Payer: Self-pay

## 2023-10-23 ENCOUNTER — Emergency Department (HOSPITAL_COMMUNITY)
Admission: EM | Admit: 2023-10-23 | Discharge: 2023-10-24 | Disposition: A | Discharge status: Home / Self Care | Payer: Medicaid Other | Attending: Emergency Medicine | Admitting: Emergency Medicine

## 2023-10-23 DIAGNOSIS — R569 Unspecified convulsions: Secondary | ICD-10-CM | POA: Diagnosis not present

## 2023-10-23 DIAGNOSIS — R000 Tachycardia, unspecified: Secondary | ICD-10-CM | POA: Diagnosis not present

## 2023-10-23 DIAGNOSIS — W19XXXA Unspecified fall, initial encounter: Secondary | ICD-10-CM | POA: Diagnosis not present

## 2023-10-23 DIAGNOSIS — R410 Disorientation, unspecified: Secondary | ICD-10-CM | POA: Diagnosis not present

## 2023-10-23 DIAGNOSIS — I10 Essential (primary) hypertension: Secondary | ICD-10-CM | POA: Diagnosis not present

## 2023-10-23 LAB — URINALYSIS, ROUTINE W REFLEX MICROSCOPIC
Bacteria, UA: NONE SEEN
Bilirubin Urine: NEGATIVE
Glucose, UA: NEGATIVE mg/dL
Hgb urine dipstick: NEGATIVE
Ketones, ur: 5 mg/dL — AB
Leukocytes,Ua: NEGATIVE
Nitrite: NEGATIVE
Protein, ur: 100 mg/dL — AB
Specific Gravity, Urine: 1.019 (ref 1.005–1.030)
pH: 6 (ref 5.0–8.0)

## 2023-10-23 LAB — COMPREHENSIVE METABOLIC PANEL
ALT: 18 U/L (ref 0–44)
AST: 22 U/L (ref 15–41)
Albumin: 4.1 g/dL (ref 3.5–5.0)
Alkaline Phosphatase: 46 U/L (ref 38–126)
Anion gap: 12 (ref 5–15)
BUN: 15 mg/dL (ref 6–20)
CO2: 21 mmol/L — ABNORMAL LOW (ref 22–32)
Calcium: 9.3 mg/dL (ref 8.9–10.3)
Chloride: 105 mmol/L (ref 98–111)
Creatinine, Ser: 0.91 mg/dL (ref 0.44–1.00)
GFR, Estimated: >60 mL/min (ref >60)
Glucose, Bld: 99 mg/dL (ref 70–99)
Potassium: 4.4 mmol/L (ref 3.5–5.1)
Sodium: 138 mmol/L (ref 135–145)
Total Bilirubin: 0.4 mg/dL (ref 0.0–1.2)
Total Protein: 7.3 g/dL (ref 6.5–8.1)

## 2023-10-23 LAB — RAPID URINE DRUG SCREEN, HOSP PERFORMED
Amphetamines: NOT DETECTED
Barbiturates: NOT DETECTED
Benzodiazepines: NOT DETECTED
Cocaine: NOT DETECTED
Opiates: NOT DETECTED
Tetrahydrocannabinol: POSITIVE — AB

## 2023-10-23 LAB — CBC WITH DIFFERENTIAL/PLATELET
Abs Immature Granulocytes: 0.03 10*3/uL (ref 0.00–0.07)
Basophils Absolute: 0 10*3/uL (ref 0.0–0.1)
Basophils Relative: 0 %
Eosinophils Absolute: 0 10*3/uL (ref 0.0–0.5)
Eosinophils Relative: 0 %
HCT: 37.9 % (ref 36.0–46.0)
Hemoglobin: 11.9 g/dL — ABNORMAL LOW (ref 12.0–15.0)
Immature Granulocytes: 0 %
Lymphocytes Relative: 26 %
Lymphs Abs: 2 10*3/uL (ref 0.7–4.0)
MCH: 21.8 pg — ABNORMAL LOW (ref 26.0–34.0)
MCHC: 31.4 g/dL (ref 30.0–36.0)
MCV: 69.5 fL — ABNORMAL LOW (ref 80.0–100.0)
Monocytes Absolute: 0.4 10*3/uL (ref 0.1–1.0)
Monocytes Relative: 5 %
Neutro Abs: 5.3 10*3/uL (ref 1.7–7.7)
Neutrophils Relative %: 69 %
Platelets: 227 10*3/uL (ref 150–400)
RBC: 5.45 MIL/uL — ABNORMAL HIGH (ref 3.87–5.11)
RDW: 15.5 % (ref 11.5–15.5)
WBC: 7.7 10*3/uL (ref 4.0–10.5)
nRBC: 0 % (ref 0.0–0.2)

## 2023-10-23 MED ORDER — SODIUM CHLORIDE 0.9 % IV BOLUS
1000.0000 mL | Freq: Once | INTRAVENOUS | Status: AC
  Administered 2023-10-23: 1000 mL via INTRAVENOUS

## 2023-10-23 MED ORDER — ONDANSETRON HCL 4 MG/2ML IJ SOLN
4.0000 mg | Freq: Once | INTRAMUSCULAR | Status: AC
  Administered 2023-10-23: 4 mg via INTRAVENOUS
  Filled 2023-10-23: qty 2

## 2023-10-23 MED ORDER — METOCLOPRAMIDE HCL 5 MG/ML IJ SOLN
10.0000 mg | INTRAMUSCULAR | Status: AC
  Administered 2023-10-23: 10 mg via INTRAVENOUS
  Filled 2023-10-23: qty 2

## 2023-10-23 MED ORDER — ACETAMINOPHEN 500 MG PO TABS
1000.0000 mg | ORAL_TABLET | Freq: Once | ORAL | Status: AC
  Administered 2023-10-23: 1000 mg via ORAL
  Filled 2023-10-23: qty 2

## 2023-10-23 NOTE — ED Triage Notes (Signed)
Patient brought in by EMS from working. Other workers heard a fall and patient was noted postictal. Per EMS patient was postictal for 10 minutes. Patient noted with blood coming from her mouth where she bit her tongue.  Patient has no history of seizures. 18g IV started to left AC.

## 2023-10-23 NOTE — ED Provider Notes (Signed)
[facility] EMERGENCY DEPARTMENT AT [facility] Provider Note   CSN: [id number] Arrival date & time: 10/23/23  2235     History {Add pertinent medical, surgical, social history, OB history to HPI:1} Chief Complaint  Patient presents with   Seizures    Sandra Moreno is a 23 y.o. female.  23 y/o female presents to the ED for suspected seizure activity. Was at work this evening when she was eating and next recalls being placed in an ambulance. Staff report hearing a "thud" and found patient on the floor. She was confused for ~10 minutes; presumptively postictal. There was no witness to any seizure activity or events leading up to patient's fall. EMS noted tongue trauma. Patient currently with headache; denies preceding headache tonight. No fevers, recent viral illness, bowel/bladder incontinence, vision changes or loss, extremity numbness or paresthesias, extremity weakness.   LMP end of December. Last ETOH intake on Monday. Did smoke marijuana yesterday. Denies any other illicit drug use. No known FHx of seizure disorder.   Seizures      Home Medications Prior to Admission medications   Medication Sig Start Date End Date Taking? Authorizing Provider  lidocaine (XYLOCAINE) 2 % solution Use as directed 15 mLs in the mouth or throat as needed for mouth pain. 03/07/23   Roxy Horseman, PA-C      Allergies    Augmentin [amoxicillin-pot clavulanate]    Review of Systems   Review of Systems  Neurological:  Positive for seizures.  Ten systems reviewed and are negative for acute change, except as noted in the HPI.    Physical Exam Updated Vital Signs BP (!) 145/90 (BP Location: Right Arm)   Pulse (!) 115   Temp 98.4 F (36.9 C) (Oral)   Resp 18   Ht 5\' 4"  (1.626 m)   Wt 122.5 kg   SpO2 100%   BMI 46.35 kg/m   Physical Exam Vitals and nursing note reviewed.  Constitutional:      General: She is not in acute distress.    Appearance: She is well-developed.  She is not diaphoretic.     Comments: Nontoxic appearing and in NAD  HENT:     Head: Normocephalic.     Comments: Hematoma to L lateral eyebrow    Right Ear: External ear normal.     Left Ear: External ear normal.     Mouth/Throat:     Mouth: Mucous membranes are moist.     Comments: Evidence of tongue biting, lateral tongue. No active bleeding. Clear oropharynx. Symmetric rise of the uvula with phonation. Eyes:     General: No scleral icterus.    Extraocular Movements: Extraocular movements intact.     Conjunctiva/sclera: Conjunctivae normal.     Pupils: Pupils are equal, round, and reactive to light.  Neck:     Comments: No meningismus  Cardiovascular:     Rate and Rhythm: Regular rhythm. Tachycardia present.     Pulses: Normal pulses.     Comments: HR 115-120bpm Pulmonary:     Effort: Pulmonary effort is normal. No respiratory distress.     Breath sounds: No stridor. No wheezing.     Comments: Respirations even and unlabored Musculoskeletal:        General: Normal range of motion.     Cervical back: Normal range of motion.  Skin:    General: Skin is warm and dry.     Coloration: Skin is not pale.     Findings: No erythema or rash.  Neurological:     General: No focal deficit present.     Mental Status: She is alert and oriented to person, place, and time.     Cranial Nerves: No cranial nerve deficit.     Coordination: Coordination normal.     Comments: GCS 15. Speech is goal oriented. No deficits appreciated to CN III-XII; symmetric eyebrow raise, no facial drooping, tongue midline. Patient has equal grip strength bilaterally with 5/5 strength against resistance in all major muscle groups bilaterally. Sensation to light touch intact. Patient moves extremities without ataxia. Patient ambulatory with steady gait.  Psychiatric:        Behavior: Behavior normal.     ED Results / Procedures / Treatments   Labs (all labs ordered are listed, but only abnormal results are  displayed) Labs Reviewed  CBC WITH DIFFERENTIAL/PLATELET - Abnormal; Notable for the following components:      Result Value   RBC 5.45 (*)    Hemoglobin 11.9 (*)    MCV 69.5 (*)    MCH 21.8 (*)    All other components within normal limits  COMPREHENSIVE METABOLIC PANEL  ETHANOL  RAPID URINE DRUG SCREEN, HOSP PERFORMED  URINALYSIS, ROUTINE W REFLEX MICROSCOPIC  HCG, SERUM, QUALITATIVE  CBG MONITORING, ED    EKG None  Radiology No results found.  Procedures Procedures  {Document cardiac monitor, telemetry assessment procedure when appropriate:1}  Medications Ordered in ED Medications  sodium chloride 0.9 % bolus 1,000 mL (1,000 mLs Intravenous New Bag/Given 10/23/23 2310)    ED Course/ Medical Decision Making/ A&P   {   Click here for ABCD2, HEART and other calculatorsREFRESH Note before signing :1}                              Medical Decision Making Amount and/or Complexity of Data Reviewed Labs: ordered. Radiology: ordered.   ***  {Document critical care time when appropriate:1} {Document review of labs and clinical decision tools ie heart score, Chads2Vasc2 etc:1}  {Document your independent review of radiology images, and any outside records:1} {Document your discussion with family members, caretakers, and with consultants:1} {Document social determinants of health affecting pt's care:1} {Document your decision making why or why not admission, treatments were needed:1} Final Clinical Impression(s) / ED Diagnoses Final diagnoses:  None    Rx / DC Orders ED Discharge Orders     None

## 2023-10-23 NOTE — ED Triage Notes (Signed)
Patient also hit head when she fell. Hematoma to left eyebrow. PERRLA.

## 2023-10-24 ENCOUNTER — Emergency Department (HOSPITAL_COMMUNITY): Payer: Medicaid Other

## 2023-10-24 LAB — ETHANOL: Alcohol, Ethyl (B): <10 mg/dL (ref <10)

## 2023-10-24 LAB — HCG, SERUM, QUALITATIVE: Preg, Serum: NEGATIVE

## 2023-10-24 NOTE — ED Notes (Signed)
Patient alert and oriented x4, discharge paperwork discuss, patient verbalized understanding.

## 2023-10-24 NOTE — ED Notes (Signed)
Patient up walking to bathroom, gait steady, patient stated she feels fine.

## 2023-10-24 NOTE — Discharge Instructions (Addendum)
Your evaluation in the emergency department today was reassuring.  Should you experience recurrent episodes of seizure-like activity, promptly return to the emergency department for repeat assessment as it may also be necessary to initiate use of medications for seizure disorder.    You have been provided a referral to neurology for outpatient evaluation.  Refrain from use of all drugs and alcohol until able to follow-up with a neurologist.  You have also been placed on strict driving precautions for at least 6 months; do not drive any motored vehicle or operate heavy machinery.  These restrictions can be lifted by a neurologist when deemed appropriate. Follow up with a primary care doctor in the interim as needed.

## 2023-10-29 ENCOUNTER — Encounter: Payer: Self-pay | Admitting: Diagnostic Neuroimaging

## 2023-10-29 ENCOUNTER — Ambulatory Visit: Payer: Medicaid Other | Admitting: Diagnostic Neuroimaging

## 2023-11-05 ENCOUNTER — Ambulatory Visit (INDEPENDENT_AMBULATORY_CARE_PROVIDER_SITE_OTHER): Payer: Medicaid Other | Admitting: Neurology

## 2023-11-05 ENCOUNTER — Encounter: Payer: Self-pay | Admitting: Neurology

## 2023-11-05 VITALS — BP 128/80 | HR 90 | Ht 64.0 in | Wt 279.5 lb

## 2023-11-05 DIAGNOSIS — G40909 Epilepsy, unspecified, not intractable, without status epilepticus: Secondary | ICD-10-CM | POA: Diagnosis not present

## 2023-11-05 MED ORDER — LEVETIRACETAM 500 MG PO TABS: 500.0000 mg | ORAL_TABLET | Freq: Two times a day (BID) | ORAL | 6 refills | Status: DC

## 2023-11-05 MED ORDER — FOLIC ACID 1 MG PO TABS: 1.0000 mg | ORAL_TABLET | Freq: Every day | ORAL | 3 refills | Status: DC

## 2023-11-05 NOTE — Progress Notes (Signed)
 [facility]  PATIENT: Sandra Moreno DOB: 2001-07-27  REQUESTING CLINICIAN: Keith Sor, PA-C HISTORY FROM: Patient  REASON FOR VISIT: New seizure    HISTORICAL  CHIEF COMPLAINT:  Chief Complaint  Patient presents with   New Patient (Initial Visit)    Rm13, alone, NP/internal ED referral for new onset seizure: no episodes since hospital and that was 1st ever sz     HISTORY OF PRESENT ILLNESS:  This is a 23 year old woman with history of migraines and obesity who is presenting after her first seizure. Patient reports that she was at work, eating, then the next thing that she remembers is EMS around her. She was told by coworker who heard a loud noise, found her on the ground shaking. She hit her head, had tongue biting but no urinary incontinence. She was very confused.  This was he first convulsion but she has reported history of behavioral arrest and confusion for the past 6 months. She can be in a middle of a conversation, then was told that she would stop talking for a minute or two then start asking the same questions over and over.  Denies waking up with blood on the pillow, no urinary incontinence, denies falling off the bed.    Handedness: Right handed  Onset:Jan 22, had a generalized convulsion but has been reporting staring spells for the past 6 months.   Seizure Type: Generalized convulsion, staring spell, and being unresponsive  Current frequency: Only 1 generalized convulsion   Any injuries from seizures: Head strike, tongue biting   Seizure risk factors: None reported   Previous ASMs: None   Currenty ASMs: None   ASMs side effects: N/A  Brain Images: Normal head CT   Previous EEGs: Not previously done    OTHER MEDICAL CONDITIONS: Headaches   REVIEW OF SYSTEMS: Full 14 system review of systems performed and negative with exception of: as noted in the HPI  ALLERGIES: Allergies  Allergen Reactions   Augmentin [Amoxicillin-Pot  Clavulanate]     HOME MEDICATIONS: Outpatient Medications Prior to Visit  Medication Sig Dispense Refill   lidocaine  (XYLOCAINE ) 2 % solution Use as directed 15 mLs in the mouth or throat as needed for mouth pain. 100 mL 0   No facility-administered medications prior to visit.    PAST MEDICAL HISTORY: History reviewed. No pertinent past medical history.  PAST SURGICAL HISTORY: History reviewed. No pertinent surgical history.  FAMILY HISTORY: Family History  Problem Relation Age of Onset   Seizures Mother    Parkinsonism Maternal Uncle     SOCIAL HISTORY: Social History   Socioeconomic History   Marital status: Single    Spouse name: Not on file   Number of children: 0   Years of education: Not on file   Highest education level: 12th grade  Occupational History   Not on file  Tobacco Use   Smoking status: Every Day    Types: Cigarettes   Smokeless tobacco: Never  Vaping Use   Vaping status: Every Day   Substances: Nicotine, Flavoring  Substance and Sexual Activity   Alcohol use: Yes    Alcohol/week: 4.0 standard drinks of alcohol    Types: 4 Standard drinks or equivalent per week   Drug use: Yes    Types: Marijuana   Sexual activity: Yes  Other Topics Concern   Not on file  Social History Narrative   Not on file   Social Drivers of Health   Financial Resource Strain: Not  on file  Food Insecurity: Not on file  Transportation Needs: Not on file  Physical Activity: Not on file  Stress: Not on file  Social Connections: Not on file  Intimate Partner Violence: Not on file    PHYSICAL EXAM  GENERAL EXAM/CONSTITUTIONAL: Vitals:  Vitals:   11/05/23 1401  BP: 128/80  Pulse: 90  Weight: 279 lb 8 oz (126.8 kg)  Height: 5' 4 (1.626 m)   Body mass index is 47.98 kg/m. Wt Readings from Last 3 Encounters:  11/05/23 279 lb 8 oz (126.8 kg)  10/23/23 270 lb (122.5 kg)  02/13/23 255 lb (115.7 kg)   Patient is in no distress; well developed, nourished and  groomed; neck is supple  MUSCULOSKELETAL: Gait, strength, tone, movements noted in Neurologic exam below  NEUROLOGIC: MENTAL STATUS:      No data to display         awake, alert, oriented to person, place and time recent and remote memory intact normal attention and concentration language fluent, comprehension intact, naming intact fund of knowledge appropriate  CRANIAL NERVE:  2nd, 3rd, 4th, 6th - Visual fields full to confrontation, extraocular muscles intact, no nystagmus 5th - facial sensation symmetric 7th - facial strength symmetric 8th - hearing intact 9th - palate elevates symmetrically, uvula midline 11th - shoulder shrug symmetric 12th - tongue protrusion midline  MOTOR:  normal bulk and tone, full strength in the BUE, BLE  SENSORY:  normal and symmetric to light touch  COORDINATION:  finger-nose-finger, fine finger movements normal  GAIT/STATION:  normal     DIAGNOSTIC DATA (LABS, IMAGING, TESTING) - I reviewed patient records, labs, notes, testing and imaging myself where available.  Lab Results  Component Value Date   WBC 7.7 10/23/2023   HGB 11.9 (L) 10/23/2023   HCT 37.9 10/23/2023   MCV 69.5 (L) 10/23/2023   PLT 227 10/23/2023      Component Value Date/Time   NA 138 10/23/2023 2237   NA 141 02/13/2023 1027   K 4.4 10/23/2023 2237   CL 105 10/23/2023 2237   CO2 21 (L) 10/23/2023 2237   GLUCOSE 99 10/23/2023 2237   BUN 15 10/23/2023 2237   BUN 12 02/13/2023 1027   CREATININE 0.91 10/23/2023 2237   CALCIUM 9.3 10/23/2023 2237   PROT 7.3 10/23/2023 2237   PROT 6.8 02/13/2023 1027   ALBUMIN 4.1 10/23/2023 2237   ALBUMIN 4.6 02/13/2023 1027   AST 22 10/23/2023 2237   ALT 18 10/23/2023 2237   ALKPHOS 46 10/23/2023 2237   BILITOT 0.4 10/23/2023 2237   BILITOT 0.3 02/13/2023 1027   GFRNONAA >60 10/23/2023 2237   GFRAA NOT CALCULATED 08/30/2016 1956   No results found for: CHOL, HDL, LDLCALC, LDLDIRECT, TRIG Lab Results   Component Value Date   HGBA1C 5.5 02/13/2023   No results found for: VITAMINB12 Lab Results  Component Value Date   TSH 2.660 02/13/2023    Head CT 11/05/2023 No acute intracranial abnormality.   I personally reviewed brain Images.   ASSESSMENT AND PLAN  23 y.o. year old female  with history of headaches and obesity who is presenting after her first generalized convulsion but there are reports of episode of staring spells. Due to stereotypical events, my suspicion for epilepsy is high. I will start her on Levetiracetam  and obtain EEG and MRI. We also discussed driving restriction for the next 6 months, she voiced understanding. Follow up in 3 months or sooner if worse.   1. Seizure disorder (HCC)  Patient Instructions  Start Levetiracetam  500 mg twice daily. Start with 250 mg twice daily and increase to 500 mg twice daily in one week.  Routine EEG, if normal then we will proceed with 3 days ambulatory EEG  MRI Brain  and without contrast  Driving restriction for the next 6 months  Follow up in 3 months or sooner if worse     Per [place]  DMV statutes, patients with seizures are not allowed to drive until they have been seizure-free for six months.  Other recommendations include using caution when using heavy equipment or power tools. Avoid working on ladders or at heights. Take showers instead of baths.  Do not swim alone.  Ensure the water temperature is not too high on the home water heater. Do not go swimming alone. Do not lock yourself in a room alone (i.e. bathroom). When caring for infants or small children, sit down when holding, feeding, or changing them to minimize risk of injury to the child in the event you have a seizure. Maintain good sleep hygiene. Avoid alcohol.  Also recommend adequate sleep, hydration, good diet and minimize stress.   During the Seizure  - First, ensure adequate ventilation and place patients on the floor on their left side  Loosen  clothing around the neck and ensure the airway is patent. If the patient is clenching the teeth, do not force the mouth open with any object as this can cause severe damage - Remove all items from the surrounding that can be hazardous. The patient may be oblivious to what's happening and may not even know what he or she is doing. If the patient is confused and wandering, either gently guide him/her away and block access to outside areas - Reassure the individual and be comforting - Call 911. In most cases, the seizure ends before EMS arrives. However, there are cases when seizures may last over 3 to 5 minutes. Or the individual may have developed breathing difficulties or severe injuries. If a pregnant patient or a person with diabetes develops a seizure, it is prudent to call an ambulance. - Finally, if the patient does not regain full consciousness, then call EMS. Most patients will remain confused for about 45 to 90 minutes after a seizure, so you must use judgment in calling for help. - Avoid restraints but make sure the patient is in a bed with padded side rails - Place the individual in a lateral position with the neck slightly flexed; this will help the saliva drain from the mouth and prevent the tongue from falling backward - Remove all nearby furniture and other hazards from the area - Provide verbal assurance as the individual is regaining consciousness - Provide the patient with privacy if possible - Call for help and start treatment as ordered by the caregiver   After the Seizure (Postictal Stage)  After a seizure, most patients experience confusion, fatigue, muscle pain and/or a headache. Thus, one should permit the individual to sleep. For the next few days, reassurance is essential. Being calm and helping reorient the person is also of importance.  Most seizures are painless and end spontaneously. Seizures are not harmful to others but can lead to complications such as stress on the  lungs, brain and the heart. Individuals with prior lung problems may develop labored breathing and respiratory distress.    Discussed Patients with epilepsy have a small risk of sudden unexpected death, a condition referred to as sudden unexpected death in epilepsy (SUDEP). SUDEP  is defined specifically as the sudden, unexpected, witnessed or unwitnessed, nontraumatic and nondrowning death in patients with epilepsy with or without evidence for a seizure, and excluding documented status epilepticus, in which post mortem examination does not reveal a structural or toxicologic cause for death     Orders Placed This Encounter  Procedures   MR BRAIN W WO CONTRAST   EEG adult    Meds ordered this encounter  Medications   levETIRAcetam  (KEPPRA ) 500 MG tablet    Sig: Take 1 tablet (500 mg total) by mouth 2 (two) times daily.    Dispense:  60 tablet    Refill:  6   folic acid  (FOLVITE ) 1 MG tablet    Sig: Take 1 tablet (1 mg total) by mouth daily.    Dispense:  90 tablet    Refill:  3    Return in about 3 months (around 02/02/2024).    Pastor Falling, MD 11/06/2023, 8:10 AM  [facility] [address], [address] [place], [place] [address] [phone]

## 2023-11-05 NOTE — Patient Instructions (Addendum)
 Start Levetiracetam  500 mg twice daily. Start with 250 mg twice daily and increase to 500 mg twice daily in one week.  Routine EEG, if normal then we will proceed with 3 days ambulatory EEG  MRI Brain  and without contrast  Driving restriction for the next 6 months  Follow up in 3 months or sooner if worse

## 2023-11-12 ENCOUNTER — Telehealth: Payer: Self-pay | Admitting: Neurology

## 2023-11-12 NOTE — Telephone Encounter (Signed)
[employer] medicaid Berkley Harvey: [id number] exp. 11/12/23-12/27/23 sent to GI [phone]

## 2023-11-19 ENCOUNTER — Telehealth: Payer: Self-pay | Admitting: Neurology

## 2023-11-19 NOTE — Telephone Encounter (Signed)
 LVM and sent mychart msg informing pt of need to reschedule 11/21/23 EEG - Tresa Endo out

## 2023-11-21 ENCOUNTER — Other Ambulatory Visit: Payer: Medicaid Other | Admitting: *Deleted

## 2023-12-04 ENCOUNTER — Ambulatory Visit (INDEPENDENT_AMBULATORY_CARE_PROVIDER_SITE_OTHER): Payer: Medicaid Other | Admitting: Neurology

## 2023-12-04 DIAGNOSIS — G40909 Epilepsy, unspecified, not intractable, without status epilepticus: Secondary | ICD-10-CM

## 2023-12-05 NOTE — Procedures (Signed)
    History:  23 year old woman with seizure disorder   EEG classification: Awake and drowsy  Duration: 26 minutes   Technical aspects: This EEG study was done with scalp electrodes positioned according to the 10-20 International system of electrode placement. Electrical activity was reviewed with band pass filter of 1-70Hz , sensitivity of 7 uV/mm, display speed of 46mm/sec with a 60Hz  notched filter applied as appropriate. EEG data were recorded continuously and digitally stored.   Description of the recording: The background rhythms of this recording consists of a fairly well modulated medium amplitude alpha rhythm of 11 Hz that is reactive to eye opening and closure. Present in the anterior head region is a 15-20 Hz beta activity. Photic stimulation was performed, did not show any abnormalities. Hyperventilation was also performed, did not show any abnormalities. Drowsiness was manifested by background fragmentation. No abnormal epileptiform discharges seen during this recording. There was no focal slowing. There were no electrographic seizure identified.   Abnormality: None   Impression: This is a normal awake and drowsy EEG. No evidence of interictal epileptiform discharges. Normal EEGs, however, do not rule out epilepsy.    Windell Norfolk, MD [facility]

## 2023-12-06 ENCOUNTER — Encounter: Payer: Self-pay | Admitting: Neurology

## 2023-12-06 ENCOUNTER — Other Ambulatory Visit: Payer: Self-pay | Admitting: Neurology

## 2023-12-06 DIAGNOSIS — G40909 Epilepsy, unspecified, not intractable, without status epilepticus: Secondary | ICD-10-CM

## 2023-12-11 ENCOUNTER — Telehealth: Payer: Self-pay

## 2023-12-11 ENCOUNTER — Telehealth: Payer: Self-pay | Admitting: Anesthesiology

## 2023-12-11 ENCOUNTER — Other Ambulatory Visit: Payer: Self-pay | Admitting: Neurology

## 2023-12-11 MED ORDER — LEVETIRACETAM 1000 MG PO TABS: 1000.0000 mg | ORAL_TABLET | Freq: Two times a day (BID) | ORAL | 3 refills | Status: DC

## 2023-12-11 NOTE — Telephone Encounter (Signed)
 Ambulatory EEG order was emailed to [facility].

## 2023-12-11 NOTE — Telephone Encounter (Signed)
 Received report from [facility] that they were on the phone with patient 3/11 and heard patient gurgling and then sounded like she fell. They were able to reconnect with patient and she stated she didn't recall the previous phone call.  I called the patient 3/12 and she stated that she remembers zoning out but states she didn't fall. She said these zoning out episodes are happening about every 2 weeks. She reports medication compliance. Denies other seizure triggers, except alcohol and reports she didn't know it was a trigger but will stop drinking. She has not been scheduled for ambulatory EEG as of my call with her.She reports she will let us know if any other episodes happen and when she is scheduled. Reviewed [place] driving law. Patient verbalized understanding.

## 2023-12-11 NOTE — Telephone Encounter (Signed)
 Please advise patient to increase Keppra to 1000 mg twice daily. She can take 2 tablets twice daily until she gets the new prescription. Thanks

## 2023-12-11 NOTE — Telephone Encounter (Signed)
 Call to patient reviewed increase in Keppra and new script being sent it. She stated she already took 1 500 mg tablet this morning and will take another right now and continue 1000 mg dose twice daily  (500 mg tablets) until used all then pick up 1000 mg tablets.  Email to [place] with [facility] about reaching back out to patient.   Call to [facility] to reconcile medication list

## 2023-12-23 ENCOUNTER — Telehealth: Payer: Self-pay | Admitting: Neurology

## 2023-12-23 NOTE — Telephone Encounter (Signed)
 LVM and sent mychart msg informing pt of need to reschedule 02/10/24 appt - MD out

## 2023-12-24 ENCOUNTER — Telehealth: Payer: Self-pay

## 2023-12-24 NOTE — Telephone Encounter (Signed)
 Call to patient, no answer. Left message to call back in regards to where she would like ambulatory EEG sent. Denied at [facility].

## 2024-02-10 ENCOUNTER — Ambulatory Visit: Payer: Medicaid Other | Admitting: Neurology

## 2024-08-10 ENCOUNTER — Telehealth: Payer: Self-pay | Admitting: Neurology

## 2024-08-10 NOTE — Telephone Encounter (Signed)
 Pt has called to schedule a seizure f/u, pt confirmed no new issues.

## 2024-08-11 ENCOUNTER — Encounter: Payer: Self-pay | Admitting: Adult Health

## 2024-08-11 ENCOUNTER — Ambulatory Visit: Payer: Self-pay | Admitting: Adult Health

## 2024-08-11 VITALS — BP 142/90 | HR 89 | Ht 64.0 in | Wt 304.0 lb

## 2024-08-11 DIAGNOSIS — Z5181 Encounter for therapeutic drug level monitoring: Secondary | ICD-10-CM

## 2024-08-11 DIAGNOSIS — G40909 Epilepsy, unspecified, not intractable, without status epilepticus: Secondary | ICD-10-CM

## 2024-08-11 DIAGNOSIS — R404 Transient alteration of awareness: Secondary | ICD-10-CM

## 2024-08-11 MED ORDER — FOLIC ACID 1 MG PO TABS: 1.0000 mg | ORAL_TABLET | Freq: Every day | ORAL | 11 refills | Status: DC

## 2024-08-11 MED ORDER — LEVETIRACETAM 750 MG PO TABS: 1500.0000 mg | ORAL_TABLET | Freq: Two times a day (BID) | ORAL | 11 refills | Status: AC
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
## 2024-08-11 NOTE — Patient Instructions (Addendum)
 Your Plan:  Increase Keppra  to 1500mg  twice daily (you will take 2 750mg  tablets twice daily) - download GoodRx app to get price [facility]  Please proceed with completing 72 hour EEG and MRI brain. I will follow up regarding the EEG and you can call [facility] to schedule [phone])  Please call with any recurrent seizure activity   No driving for 6 months post seizure per [place] law      Follow up with Dr. Gregg in 6 months or call earlier if needed      Thank you for coming to see us  at [facility]. I hope we have been able to provide you high quality care today.  You may receive a patient satisfaction survey over the next few weeks. We would appreciate your feedback and comments so that we may continue to improve ourselves and the health of our patients.

## 2024-08-12 ENCOUNTER — Ambulatory Visit: Payer: Self-pay | Admitting: Adult Health

## 2024-08-12 LAB — COMPREHENSIVE METABOLIC PANEL WITH GFR
ALT: 17 IU/L (ref 0–32)
AST: 14 IU/L (ref 0–40)
Albumin: 4.6 g/dL (ref 4.0–5.0)
Alkaline Phosphatase: 57 IU/L (ref 41–116)
BUN/Creatinine Ratio: 10 (ref 9–23)
BUN: 8 mg/dL (ref 6–20)
Bilirubin Total: 0.3 mg/dL (ref 0.0–1.2)
CO2: 23 mmol/L (ref 20–29)
Calcium: 9.4 mg/dL (ref 8.7–10.2)
Chloride: 101 mmol/L (ref 96–106)
Creatinine, Ser: 0.84 mg/dL (ref 0.57–1.00)
Globulin, Total: 2.4 g/dL (ref 1.5–4.5)
Glucose: 87 mg/dL (ref 70–99)
Potassium: 4.3 mmol/L (ref 3.5–5.2)
Sodium: 138 mmol/L (ref 134–144)
Total Protein: 7 g/dL (ref 6.0–8.5)
eGFR: 99 mL/min/1.73 (ref >59)

## 2024-08-12 LAB — CBC
Hematocrit: 39.2 % (ref 34.0–46.6)
Hemoglobin: 11.6 g/dL (ref 11.1–15.9)
MCH: 21.1 pg — ABNORMAL LOW (ref 26.6–33.0)
MCHC: 29.6 g/dL — ABNORMAL LOW (ref 31.5–35.7)
MCV: 71 fL — ABNORMAL LOW (ref 79–97)
Platelets: 289 x10E3/uL (ref 150–450)
RBC: 5.49 x10E6/uL — ABNORMAL HIGH (ref 3.77–5.28)
RDW: 14.4 % (ref 11.7–15.4)
WBC: 6.8 x10E3/uL (ref 3.4–10.8)

## 2024-08-12 LAB — LEVETIRACETAM LEVEL: Levetiracetam Lvl: 23.1 ug/mL (ref 10.0–40.0)

## 2024-09-21 ENCOUNTER — Encounter: Payer: Self-pay | Admitting: Adult Health

## 2024-09-21 DIAGNOSIS — G40909 Epilepsy, unspecified, not intractable, without status epilepticus: Secondary | ICD-10-CM

## 2024-09-22 NOTE — Telephone Encounter (Signed)
 LVM to call back and discuss mychart messages concerning seizures Per last office note  Increase Keppra  to 1500mg  twice daily (you will take 2 750mg  tablets twice daily)  and Please proceed with completing 72 hour EEG and MRI brain. . Did patient  increase keppra  ?  and none  of the imaging was complete . Did state  on VM if symptoms get worse or she has another seizure please proceed to ED to get evaluated.

## 2024-09-22 NOTE — Telephone Encounter (Signed)
 Placed order for 72  EEG in Jessica,NP box

## 2024-09-22 NOTE — Telephone Encounter (Addendum)
 Spoke to patient states doing better . Pt states did increase dosage as discussed and haven't missed a dose . Pt states no increased stress . Pt states will consider adding medication but wanted to know which medication . Made patient aware will forward to Jessica,NP to review and give her recommendations . Gave patient [facility] # to call and schedule MRI brain and will send order for 72 hour EEG today . Pt thanked me for calling  Pt aware if she has another seizures or increased increased symptoms please go to ED to get evaluated

## 2024-09-23 MED ORDER — LACOSAMIDE 50 MG PO TABS: ORAL_TABLET | ORAL | 0 refills | Status: DC

## 2024-09-23 NOTE — Addendum Note (Signed)
 Addended by: WHITFIELD RAISIN L on: 09/23/2024 11:36 AM   Modules accepted: Orders

## 2024-10-07 ENCOUNTER — Telehealth: Payer: Self-pay

## 2024-10-07 MED ORDER — FOLIC ACID 1 MG PO TABS: 1.0000 mg | ORAL_TABLET | Freq: Every day | ORAL | 11 refills | Status: DC

## 2024-10-07 NOTE — Telephone Encounter (Signed)
 Refill sent to pharmacy.

## 2024-10-07 NOTE — Telephone Encounter (Signed)
 If previously rx'd by Dr. Gregg, okay to fill. Thank you

## 2024-10-08 ENCOUNTER — Other Ambulatory Visit: Payer: Self-pay

## 2024-10-08 MED ORDER — FOLIC ACID 1 MG PO TABS: 1.0000 mg | ORAL_TABLET | Freq: Every day | ORAL | 11 refills | Status: AC

## 2024-10-13 ENCOUNTER — Inpatient Hospital Stay: Admission: RE | Admit: 2024-10-13 | Payer: Self-pay | Source: Ambulatory Visit

## 2024-10-29 ENCOUNTER — Telehealth: Payer: Self-pay | Admitting: *Deleted

## 2024-10-29 NOTE — Telephone Encounter (Signed)
 Received word from [place] [facility]/ [facility] that pt's insurance has termed and they have been unable to reach the patient. They need her updated insurance.   I called the pt and LVM (ok per DPR) letting her know and I asked her to call [facility] asap to provider her updated insurance. Gave her their # to call [phone]. I also sent the pt a mychart message to her active account.

## 2024-11-03 ENCOUNTER — Other Ambulatory Visit: Payer: Self-pay | Admitting: Adult Health

## 2024-11-03 DIAGNOSIS — G40909 Epilepsy, unspecified, not intractable, without status epilepticus: Secondary | ICD-10-CM

## 2025-03-11 ENCOUNTER — Ambulatory Visit: Payer: Self-pay | Admitting: Neurology
